# Patient Record
Sex: Female | Born: 1985 | Race: White | Hispanic: No | Marital: Single | Smoking: Former smoker
Health system: Southern US, Community
[De-identification: ages and names within clinical notes are randomized; demographics above are authoritative.]

## PROBLEM LIST (undated history)

## (undated) DIAGNOSIS — T7840XA Allergy, unspecified, initial encounter: Secondary | ICD-10-CM

## (undated) DIAGNOSIS — D72829 Elevated white blood cell count, unspecified: Secondary | ICD-10-CM

## (undated) HISTORY — DX: Elevated white blood cell count, unspecified: D72.829

## (undated) HISTORY — DX: Allergy, unspecified, initial encounter: T78.40XA

---

## 2010-01-16 ENCOUNTER — Emergency Department (HOSPITAL_COMMUNITY): Admission: EM | Admit: 2010-01-16 | Discharge: 2010-01-16 | Payer: Self-pay | Admitting: Emergency Medicine

## 2010-03-10 ENCOUNTER — Emergency Department (HOSPITAL_COMMUNITY): Admission: EM | Admit: 2010-03-10 | Discharge: 2010-03-10 | Payer: Self-pay | Admitting: Emergency Medicine

## 2010-04-17 ENCOUNTER — Emergency Department (HOSPITAL_COMMUNITY): Admission: EM | Admit: 2010-04-17 | Discharge: 2010-04-17 | Payer: Self-pay | Admitting: Emergency Medicine

## 2010-04-28 ENCOUNTER — Ambulatory Visit (HOSPITAL_COMMUNITY): Admission: RE | Admit: 2010-04-28 | Discharge: 2010-04-28 | Payer: Self-pay | Admitting: Neurology

## 2010-11-28 ENCOUNTER — Encounter: Payer: Self-pay | Admitting: Neurology

## 2011-01-23 LAB — HERPES SIMPLEX VIRUS(HSV) DNA BY PCR: HSV 1 DNA: NOT DETECTED

## 2011-01-23 LAB — CSF CELL COUNT WITH DIFFERENTIAL

## 2011-01-23 LAB — PROTEIN AND GLUCOSE, CSF: Total  Protein, CSF: 34 mg/dL (ref 15–45)

## 2011-01-24 LAB — CBC
HCT: 38.4 % (ref 36.0–46.0)
Hemoglobin: 13.1 g/dL (ref 12.0–15.0)
MCHC: 34.2 g/dL (ref 30.0–36.0)
MCV: 87.9 fL (ref 78.0–100.0)
Platelets: 373 10*3/uL (ref 150–400)
RBC: 4.37 MIL/uL (ref 3.87–5.11)

## 2011-01-24 LAB — BASIC METABOLIC PANEL
BUN: 3 mg/dL — ABNORMAL LOW (ref 6–23)
Calcium: 9.1 mg/dL (ref 8.4–10.5)
Chloride: 111 mEq/L (ref 96–112)
Creatinine, Ser: 0.56 mg/dL (ref 0.4–1.2)
GFR calc non Af Amer: >60 mL/min (ref >60)
Glucose, Bld: 96 mg/dL (ref 70–99)
Potassium: 2.9 mEq/L — ABNORMAL LOW (ref 3.5–5.1)
Sodium: 139 mEq/L (ref 135–145)

## 2011-01-24 LAB — DIFFERENTIAL
Basophils Absolute: 0.1 10*3/uL (ref 0.0–0.1)
Eosinophils Absolute: 0.2 10*3/uL (ref 0.0–0.7)
Eosinophils Relative: 1 % (ref 0–5)
Monocytes Absolute: 0.9 10*3/uL (ref 0.1–1.0)

## 2011-01-24 LAB — VITAMIN B12: Vitamin B-12: 259 pg/mL (ref 211–911)

## 2011-01-24 LAB — MISCELLANEOUS TEST

## 2015-11-28 ENCOUNTER — Encounter (HOSPITAL_COMMUNITY): Payer: Self-pay | Admitting: Emergency Medicine

## 2015-11-28 ENCOUNTER — Emergency Department (HOSPITAL_COMMUNITY)
Admission: EM | Admit: 2015-11-28 | Discharge: 2015-11-28 | Disposition: A | Discharge status: Home / Self Care | Payer: PRIVATE HEALTH INSURANCE | Attending: Emergency Medicine | Admitting: Emergency Medicine

## 2015-11-28 ENCOUNTER — Emergency Department (HOSPITAL_COMMUNITY): Payer: PRIVATE HEALTH INSURANCE

## 2015-11-28 DIAGNOSIS — J219 Acute bronchiolitis, unspecified: Secondary | ICD-10-CM | POA: Diagnosis not present

## 2015-11-28 DIAGNOSIS — R509 Fever, unspecified: Secondary | ICD-10-CM | POA: Diagnosis present

## 2015-11-28 DIAGNOSIS — Z79899 Other long term (current) drug therapy: Secondary | ICD-10-CM | POA: Diagnosis not present

## 2015-11-28 DIAGNOSIS — J40 Bronchitis, not specified as acute or chronic: Secondary | ICD-10-CM

## 2015-11-28 MED ORDER — ALBUTEROL SULFATE HFA 108 (90 BASE) MCG/ACT IN AERS
INHALATION_SPRAY | RESPIRATORY_TRACT | Status: AC
  Filled 2015-11-28: qty 6.7

## 2015-11-28 MED ORDER — BENZONATATE 100 MG PO CAPS: 100.0000 mg | ORAL_CAPSULE | Freq: Three times a day (TID) | ORAL | Status: DC

## 2015-11-28 MED ORDER — METHYLPREDNISOLONE 4 MG PO TBPK: ORAL_TABLET | ORAL | Status: DC

## 2015-11-28 MED ORDER — ALBUTEROL SULFATE HFA 108 (90 BASE) MCG/ACT IN AERS: 2.0000 | INHALATION_SPRAY | RESPIRATORY_TRACT | Status: DC | PRN

## 2015-11-28 MED ORDER — ACETAMINOPHEN 325 MG PO TABS
650.0000 mg | ORAL_TABLET | Freq: Once | ORAL | Status: AC
  Administered 2015-11-28: 650 mg via ORAL
  Filled 2015-11-28: qty 2

## 2015-11-28 MED ORDER — ALBUTEROL SULFATE HFA 108 (90 BASE) MCG/ACT IN AERS
2.0000 | INHALATION_SPRAY | RESPIRATORY_TRACT | Status: DC
  Administered 2015-11-28 (×2): 2 via RESPIRATORY_TRACT

## 2015-11-28 NOTE — Discharge Instructions (Signed)

## 2015-11-28 NOTE — ED Provider Notes (Signed)
CSN: [id number]     Arrival date & time 11/28/15  1741 History   First MD Initiated Contact with Patient 11/28/15 1813     Chief Complaint  Patient presents with  . 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  No n/v/d.  Sx are persistent.  She had tried Motrin and Mucinex with some relief.  Patient is a 30 y.o. female presenting with fever. The history is provided by the patient.  Fever   History reviewed. No pertinent past medical history. History reviewed. No pertinent past surgical history. History reviewed. No pertinent family history. Social History  Substance Use Topics  . Smoking status: Never Smoker   . Smokeless tobacco: None  . Alcohol Use: No   OB History    No data available     Review of Systems  Constitutional: Positive for fever.  All other systems reviewed and are negative.     Allergies  Review of patient's allergies indicates no known allergies.  Home Medications   Prior to Admission medications   Medication Sig Start Date End Date Taking? Authorizing Provider  albuterol (PROVENTIL HFA;VENTOLIN HFA) 108 (90 Base) MCG/ACT inhaler Inhale 2 puffs into the lungs every 4 (four) hours as needed for wheezing or shortness of breath. 11/28/15   Eber Hong, MD  benzonatate (TESSALON) 100 MG capsule Take 1 capsule (100 mg total) by mouth every 8 (eight) hours. 11/28/15   Eber Hong, MD  methylPREDNISolone (MEDROL DOSEPAK) 4 MG TBPK tablet Taper over 6 days 11/28/15   Eber Hong, MD   BP 136/79 mmHg  Pulse 108  Temp(Src) 101.4 F (38.6 C) (Oral)  Resp 16  Ht  (1.499 m)  Wt 218 lb (98.884 kg)  BMI 44.01 kg/m2   SpO2 97%  LMP 11/27/2015 Physical Exam  Constitutional: She appears well-developed and well-nourished. No distress.  HENT:  Head: Normocephalic and atraumatic.  Mouth/Throat: Oropharynx is clear and moist. No oropharyngeal exudate.  Eyes: Conjunctivae and EOM are normal. Pupils are equal, round, and reactive to light. Right eye exhibits no discharge. Left eye exhibits no discharge. No scleral icterus.  Neck: Normal range of motion. Neck supple. No JVD present. No thyromegaly present.  Cardiovascular: Normal rate, regular rhythm, normal heart sounds and intact distal pulses.  Exam reveals no gallop and no friction rub.   No murmur heard. Pulmonary/Chest: Effort normal and breath sounds normal. No respiratory distress. She has no wheezes. She has no rales.  Lungs - Normal respiratory effort, chest expands symmetrically. Lungs are clear to auscultation, no crackles or wheezes.    Abdominal: Soft. Bowel sounds are normal. She exhibits no distension and no mass. There is no tenderness.  Musculoskeletal: Normal range of motion. She exhibits no edema or tenderness.  Lymphadenopathy:    She has no cervical adenopathy.  Neurological: She is alert. Coordination normal.  Skin: Skin is warm and dry. No rash noted. No erythema.  Psychiatric: She has a normal mood and affect. Her behavior is normal.  Nursing note and vitals reviewed.   ED Course  Procedures (including critical care time) Labs Review Labs Reviewed - No data to display  Imaging  Review Dg Chest 2 View  11/28/2015  CLINICAL DATA:  Fever and cough since yesterday. EXAM: CHEST  2 VIEW COMPARISON:  None. FINDINGS: Cardiomediastinal silhouette is normal. Mediastinal contours appear intact. There is no evidence of focal airspace consolidation, pleural effusion or pneumothorax. Osseous structures are without acute abnormality. Soft tissues are grossly normal. IMPRESSION: No active cardiopulmonary disease. Electronically Signed   By: Ted Mcalpine M.D.   On: 11/28/2015 18:34   I have personally reviewed and evaluated these images and lab results as part of my medical decision-making.    MDM   Final diagnoses:  Bronchitis    URI  Xray neg meds given as below for home Very well appearing, normal VS other than fever, no signs of sepsis.  Meds given in ED:  Medications  acetaminophen (TYLENOL) tablet 650 mg (650 mg Oral Given 11/28/15 1820)    New Prescriptions   ALBUTEROL (PROVENTIL HFA;VENTOLIN HFA) 108 (90 BASE) MCG/ACT INHALER    Inhale 2 puffs into the lungs every 4 (four) hours as needed for wheezing or shortness of breath.   BENZONATATE (TESSALON) 100 MG CAPSULE    Take 1 capsule (100 mg total) by mouth every 8 (eight) hours.   METHYLPREDNISOLONE (MEDROL DOSEPAK) 4 MG TBPK TABLET    Taper over 6 days        Eber Hong, MD 11/28/15 [phone]

## 2015-11-28 NOTE — ED Notes (Addendum)
PT c/o nasal drainage and congested productive yellow sputum cough with body aches and chills x1 day. PT stated started taking mucinex yesterday. PT states she had ibuprofen  x3 hours ago.

## 2016-09-17 IMAGING — DX DG CHEST 2V
2 series · 2 of 2 positions shown · non-contrast
Comparison: None.

CLINICAL DATA: Fever and cough since yesterday.

EXAM:
CHEST  2 VIEW

[chest pa]
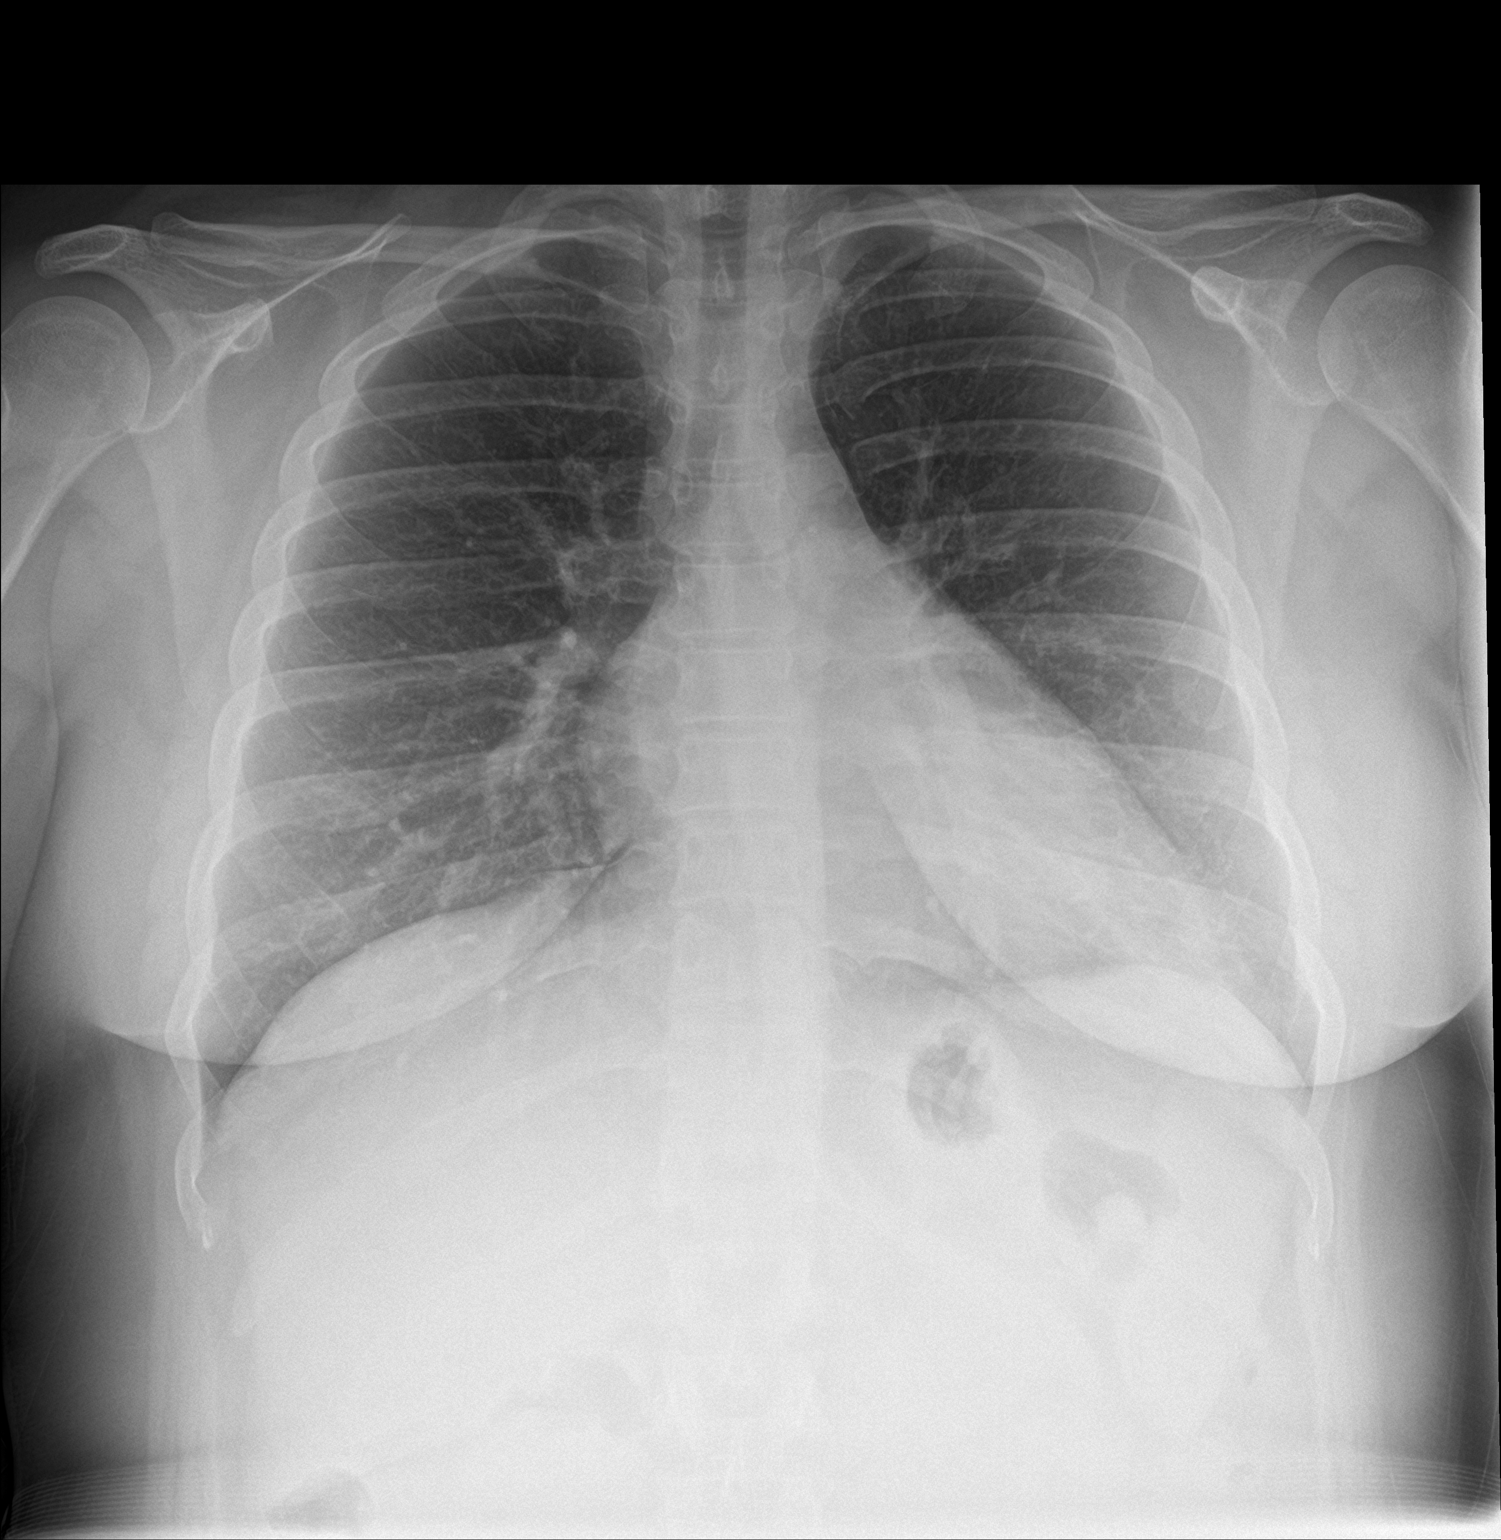

[chest lat]
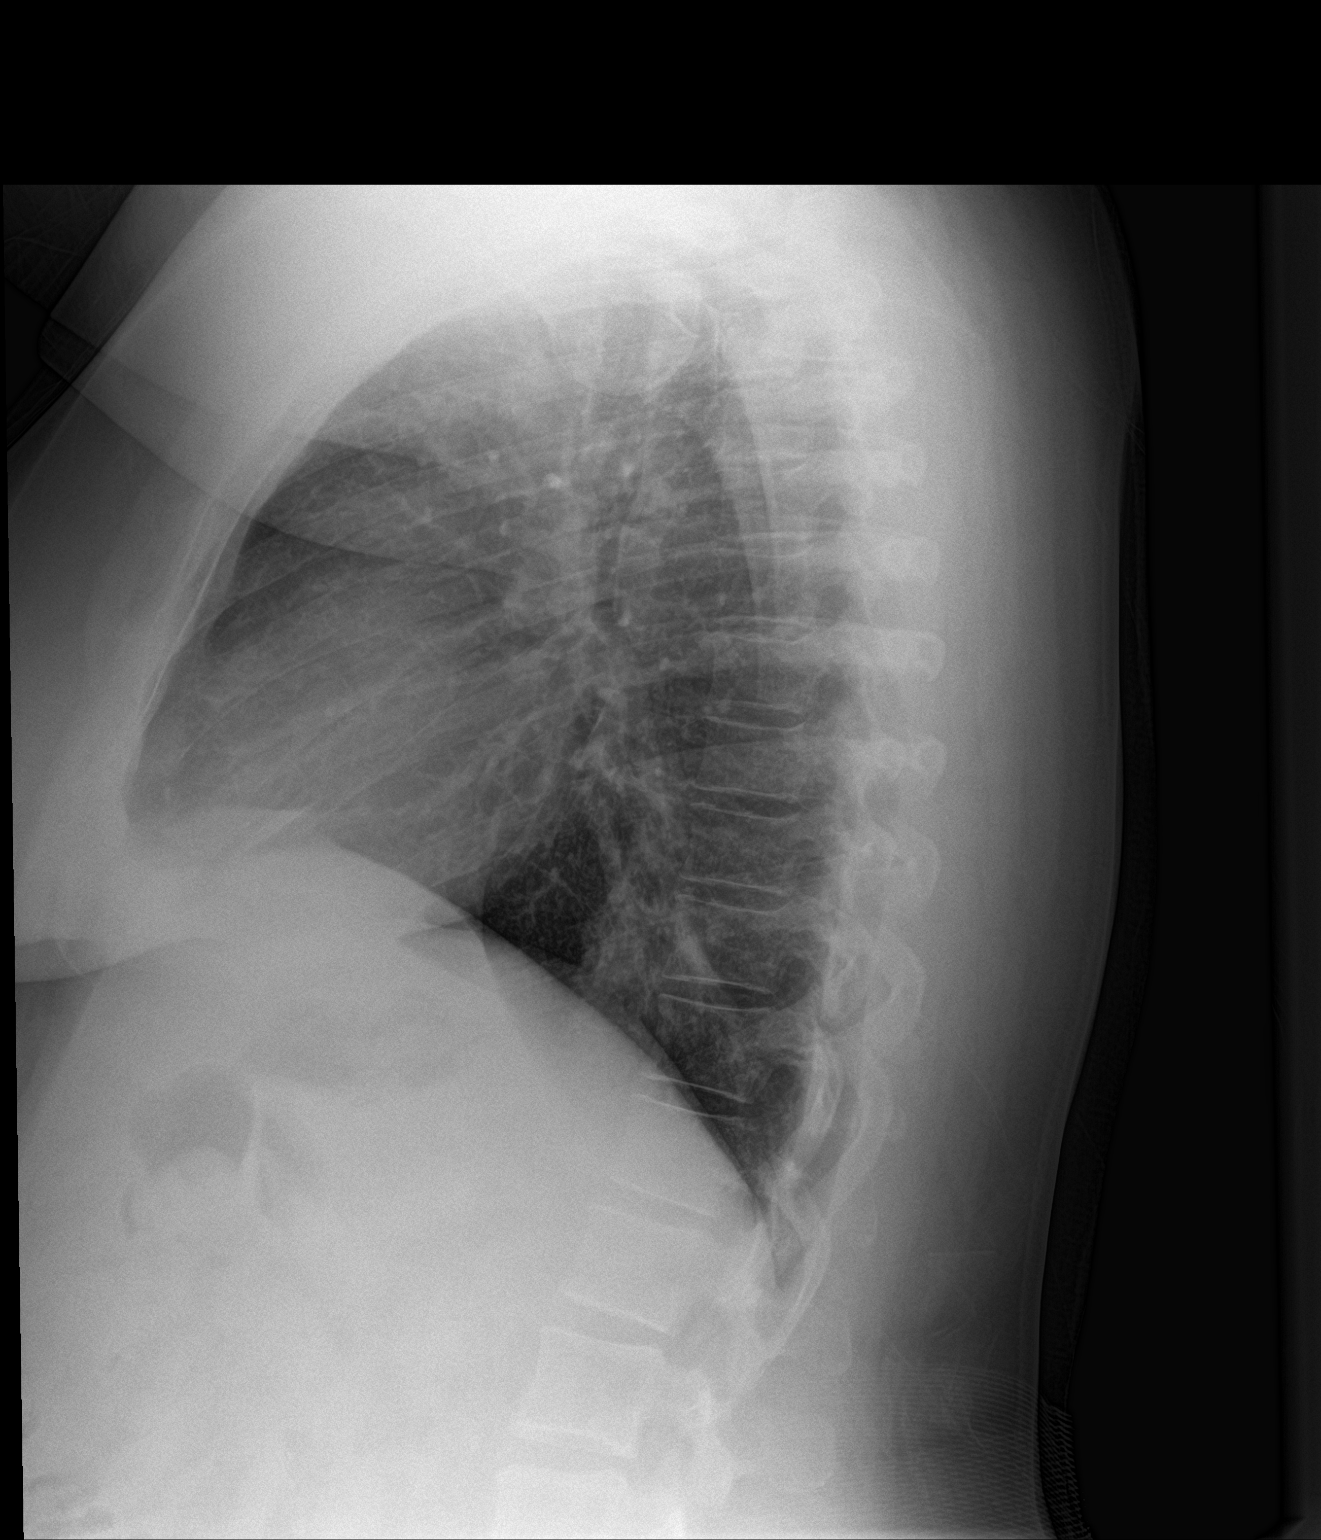

[2 of 2 positions shown; findings below may reference images not displayed]

FINDINGS: Cardiomediastinal silhouette is normal. Mediastinal contours appear
intact.

There is no evidence of focal airspace consolidation, pleural
effusion or pneumothorax.

Osseous structures are without acute abnormality. Soft tissues are
grossly normal.
IMPRESSION: No active cardiopulmonary disease.

## 2016-10-11 ENCOUNTER — Ambulatory Visit (HOSPITAL_COMMUNITY): Payer: No Typology Code available for payment source | Admitting: Oncology

## 2016-11-16 ENCOUNTER — Ambulatory Visit (HOSPITAL_COMMUNITY): Payer: No Typology Code available for payment source | Admitting: Oncology

## 2016-12-28 ENCOUNTER — Ambulatory Visit (HOSPITAL_COMMUNITY): Payer: No Typology Code available for payment source | Admitting: Oncology

## 2016-12-29 ENCOUNTER — Encounter (HOSPITAL_COMMUNITY): Payer: Self-pay | Admitting: Oncology

## 2016-12-29 ENCOUNTER — Encounter (HOSPITAL_COMMUNITY): Payer: PRIVATE HEALTH INSURANCE | Attending: Oncology | Admitting: Oncology

## 2016-12-29 ENCOUNTER — Encounter (HOSPITAL_COMMUNITY): Payer: PRIVATE HEALTH INSURANCE

## 2016-12-29 DIAGNOSIS — Z808 Family history of malignant neoplasm of other organs or systems: Secondary | ICD-10-CM | POA: Diagnosis not present

## 2016-12-29 DIAGNOSIS — Z8041 Family history of malignant neoplasm of ovary: Secondary | ICD-10-CM | POA: Diagnosis not present

## 2016-12-29 DIAGNOSIS — D720 Genetic anomalies of leukocytes: Secondary | ICD-10-CM

## 2016-12-29 DIAGNOSIS — D72829 Elevated white blood cell count, unspecified: Secondary | ICD-10-CM | POA: Insufficient documentation

## 2016-12-29 DIAGNOSIS — Z87891 Personal history of nicotine dependence: Secondary | ICD-10-CM

## 2016-12-29 DIAGNOSIS — D72820 Lymphocytosis (symptomatic): Secondary | ICD-10-CM | POA: Diagnosis not present

## 2016-12-29 DIAGNOSIS — Z800 Family history of malignant neoplasm of digestive organs: Secondary | ICD-10-CM | POA: Diagnosis not present

## 2016-12-29 HISTORY — DX: Elevated white blood cell count, unspecified: D72.829

## 2016-12-29 LAB — BASIC METABOLIC PANEL
Anion gap: 8 (ref 5–15)
BUN: 8 mg/dL (ref 6–20)
CALCIUM: 9.2 mg/dL (ref 8.9–10.3)
CHLORIDE: 105 mmol/L (ref 101–111)
CO2: 23 mmol/L (ref 22–32)
Creatinine, Ser: 0.5 mg/dL (ref 0.44–1.00)
GFR calc non Af Amer: >60 mL/min (ref >60)
GLUCOSE: 86 mg/dL (ref 65–99)
POTASSIUM: 4.3 mmol/L (ref 3.5–5.1)
SODIUM: 136 mmol/L (ref 135–145)

## 2016-12-29 LAB — CBC WITH DIFFERENTIAL/PLATELET
BASOS PCT: 0 %
Basophils Absolute: 0 10*3/uL (ref 0.0–0.1)
EOS ABS: 0.3 10*3/uL (ref 0.0–0.7)
EOS PCT: 2 %
HCT: 41.7 % (ref 36.0–46.0)
HEMOGLOBIN: 14.3 g/dL (ref 12.0–15.0)
Lymphocytes Relative: 29 %
Lymphs Abs: 3.8 10*3/uL (ref 0.7–4.0)
MCH: 29.4 pg (ref 26.0–34.0)
MCHC: 34.3 g/dL (ref 30.0–36.0)
MCV: 85.6 fL (ref 78.0–100.0)
Monocytes Absolute: 0.7 10*3/uL (ref 0.1–1.0)
Monocytes Relative: 6 %
NEUTROS PCT: 63 %
Neutro Abs: 8.2 10*3/uL — ABNORMAL HIGH (ref 1.7–7.7)
PLATELETS: 407 10*3/uL — AB (ref 150–400)
RBC: 4.87 MIL/uL (ref 3.87–5.11)
RDW: 13.1 % (ref 11.5–15.5)
WBC: 13 10*3/uL — AB (ref 4.0–10.5)

## 2016-12-29 LAB — SEDIMENTATION RATE: Sed Rate: 18 mm/hr (ref 0–22)

## 2016-12-29 LAB — C-REACTIVE PROTEIN: CRP: <0.8 mg/dL (ref <1.0)

## 2016-12-29 NOTE — Patient Instructions (Signed)
[facility] at [facility] Discharge Instructions  RECOMMENDATIONS MADE BY THE CONSULTANT AND ANY TEST RESULTS WILL BE SENT TO YOUR REFERRING PHYSICIAN.  You were seen today by Jenita Seashoreom Kefalas PA-C. Labs today, we will call you with results. Return 3-4 wks for follow up.   Thank you for choosing [facility] at [facility] to provide your oncology and hematology care.  To afford each patient quality time with our provider, please arrive at least 15 minutes before your scheduled appointment time.    If you have a lab appointment with the Cancer Center please come in thru the  Main Entrance and check in at the main information desk  You need to re-schedule your appointment should you arrive 10 or more minutes late.  We strive to give you quality time with our providers, and arriving late affects you and other patients whose appointments are after yours.  Also, if you no show three or more times for appointments you may be dismissed from the clinic at the providers discretion.     Again, thank you for choosing [facility].  Our hope is that these requests will decrease the amount of time that you wait before being seen by our physicians.       _____________________________________________________________  Should you have questions after your visit to [facility], please contact our office at [phone] between the hours of 8:30 a.m. and 4:30 p.m.  Voicemails left after 4:30 p.m. will not be returned until the following business day.  For prescription refill requests, have your pharmacy contact our office.       Resources For Cancer Patients and their Caregivers ? American Cancer Society: Can assist with transportation, wigs, general needs, runs Look Good Feel Better.        ([phone]-(403)[phone] ? Cancer Care: Provides financial assistance, online support groups, medication/co-pay assistance.  1-800-813-HOPE 986-192-1868(4673) ? [facility] Assists [place] cancer patients and their families through emotional , educational and financial support.  [phone] ? [place] DSS Where to apply for food stamps, Medicaid and utility assistance. 630-153-3837609 799 8697 ? [facility]: Transportation to medical appointments. 315-794-7721463 162 9046 ? Social Security Administration: May apply for disability if have a Stage IV cancer. ([phone] [phone])[phone] ? [place] [employer], Disability and Transit Services: Assists with nutrition, care and transit needs. 938-790-0719(407)515-0551  Cancer Center Support Programs: @10RELATIVEDAYS @ > Cancer Support Group  2nd Tuesday of the month 1pm-2pm, Journey Room  > Creative Journey  3rd Tuesday of the month 1130am-1pm, Journey Room  > Look Good Feel Better  1st Wednesday of the month 10am-12 noon, Journey Room (Call American Cancer Society to register [phone])

## 2016-12-29 NOTE — Assessment & Plan Note (Addendum)
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  I reviewed the potential causes of leukocytosis (specifically mild neutrophilia) including but not limited to: ?Any active inflammatory condition or infection ?Cigarette smoking, which may be the most common cause of mild neutrophilia ?Previously diagnosed hematologic disease (such as acute and chronic leukemias, chronic myeloproliferative or myelodysplastic disease) ?The presence of, and treatment for, a chronic anxiety state, panic disorder, rage, or emotional stress (eg, posttraumatic stress disorder, depression) ?Presence of non-hematologic diseases known to increase neutrophil counts (eg, eclampsia, thyroid storm, hypercortisolism). ?Prior splenectomy or known asplenia ?Positive family history of neutrophilia ?Recent vaccination  ?Medications - Various medications may cause neutrophilia. However,  such cases are rare and appear in the literature as isolated case reports.  Plan today is to proceed with a CBC with peripheral smear review, evaluation for MPD, BCR-ABL to r/o CML.  CRP and ESR to look for occult inflammatory disease  We will see the patient back once lab results are reported in approximately 3-4 weeks. We will go over everything at that time.  If workup is negative, the frequency of follow-up appointment with labs will be significantly reduced.

## 2016-12-29 NOTE — Progress Notes (Signed)
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  HISTORY OF PRESENT ILLNESS:   Shirley Jacobs is a 31 y.o. 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  She was referred to hematology in the past and scheduled for an appointment but she was a no-show.  She reports that she was "scared."  She reports being told for years that she has had a high white blood cell count.  She is asymptomatic from this standpoint.  She notes one, maybe 2, antibiotics in the past 12 months.  She reports an episode in September in which she was congested and needed an antibiotic.  She denies any hospitalizations.  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  She denies any B symptoms.  Her appetite is good.  She denies any unintentional weight loss.  Denies any chest pain or abdominal pain.  Denies any lumps or bumps.  She is hesitant about future follow-up and really does not want to come back if not needed.  She is advised that we will start her workup for leukocytosis and given the patient reported stability of this issue we suspect this will be a benign problem.  She does admit to a 5-pack-year smoking history having smoked a half a pack per day 10 years.  She quit smoking 3 years ago.  Review of Systems   Constitutional: Negative.  Negative for chills, fever and weight loss.  HENT: Negative.   Eyes: Negative.   Respiratory: Negative.  Negative for cough.   Cardiovascular: Negative.  Negative for chest pain.  Gastrointestinal: Negative.  Negative for blood in stool, constipation, diarrhea, melena, nausea and vomiting.  Genitourinary: Negative.   Musculoskeletal: Negative.   Skin: Negative.   Neurological: Negative.  Negative for weakness.  Endo/Heme/Allergies: Negative.   Psychiatric/Behavioral: Negative.      PAST MEDICAL HISTORY:   Past Medical History:  Diagnosis Date  . Allergy   . Leukocytosis 12/29/2016    ALLERGIES: Allergies  Allergen Reactions  . Reglan [Metoclopramide]     Felt anxious      MEDICATIONS: I have reviewed the patient's current medications.    Current Outpatient Prescriptions on File Prior to Visit  Medication Sig Dispense Refill  . ibuprofen (ADVIL,MOTRIN) 200 MG tablet Take 200 mg by mouth every 6 (six) hours as needed for mild pain or moderate pain.     No current facility-administered medications on file prior to visit.      PAST SURGICAL HISTORY History reviewed. No pertinent surgical history.  FAMILY HISTORY: Family History  Problem Relation Age of Onset  . Obesity Mother   . Hypertension Mother   . Cancer Maternal Grandmother     uterine  . Cancer Maternal Grandfather     stomach, liver  . Emphysema Paternal Grandmother   . Cancer Cousin 25    ovarian, had hysterectomy  Mother is alive at the age of 43 in good health with hypertension and obesity. Father is alive and in his 35s.  He is estranged from the family. Patient has no siblings. Patient has no children. Patient does not have a family history of an elevated white blood cell count.  SOCIAL HISTORY:  reports that she quit smoking about 3 years ago. Her smoking use included Cigarettes. She has a 5.00 pack-year smoking history. She has never used smokeless tobacco. She  reports that she does not drink alcohol or use drugs.  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  She is [place] and religion.  She is married 2 years.  She has no children.  Social History   Social History  . Marital status: Single    Spouse name: N/A  . Number of children: N/A  . Years of education: N/A   Social History Main Topics  . Smoking status: Former Smoker    Packs/day: 0.50    Years: 10.00    Types: Cigarettes    Quit date: 12/29/2013  . Smokeless tobacco: Never Used  . Alcohol use No  . Drug use: No  . Sexual activity: Yes   Other Topics Concern  . None   Social History Narrative  . None    PERFORMANCE STATUS: The patient's performance status is 0 - Asymptomatic  PHYSICAL EXAM: Most Recent Vital Signs: Blood pressure (!) 91/43, pulse 81, resp. rate 16, height 4' 11"  (1.499 m), weight 213 lb (96.6 kg), SpO2 99 %. BP (!) 91/43 (BP Location: Left Arm, Patient Position: Sitting)   Pulse 81   Resp 16   Ht 4' 11"  (1.499 m)   Wt 213 lb (96.6 kg)   SpO2 99%   BMI 43.02 kg/m   General Appearance:    Alert, cooperative, no distress, appears stated age, obese  Head:    Normocephalic, without obvious abnormality, atraumatic  Eyes:    PERRL, conjunctiva/corneas clear, EOM's intact, fundi    benign, both eyes  Ears:    Not examined  Nose:   Nares normal, septum midline, mucosa normal, no drainage    or sinus tenderness  Throat:   Lips, mucosa, and tongue normal; teeth and gums normal  Neck:   Supple, symmetrical, trachea midline, no adenopathy;    thyroid:  no enlargement/tenderness/nodules; no carotid   bruit or JVD  Back:     Symmetric, no curvature, ROM normal, no CVA tenderness  Lungs:     Clear to auscultation bilaterally, respirations unlabored  Chest Wall:    No tenderness or deformity   Heart:    Regular rate and rhythm, S1 and S2 normal, no murmur, rub   or gallop  Breast Exam:    No tenderness, masses, or nipple  abnormality  Abdomen:     Soft, non-tender, bowel sounds active all four quadrants,    no masses, no organomegaly, obese  Genitalia:    Not examined  Rectal:    Not examined  Extremities:   Extremities normal, atraumatic, no cyanosis or edema  Pulses:   2+ and symmetric all extremities  Skin:   Skin color, texture, turgor normal, no rashes or lesions  Lymph nodes:   Cervical, supraclavicular, and axillary nodes normal  Neurologic:   CNII-XII intact, normal strength, sensation and reflexes    throughout    LABORATORY DATA:  No results found for this or any previous visit (from the past 48 hour(s)).  RADIOGRAPHY: No results found.  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  I reviewed the potential causes of leukocytosis (specifically mild neutrophilia) including but not limited to: ?Any active inflammatory condition or infection ?Cigarette smoking, which may be the most common cause of mild neutrophilia ?Previously diagnosed hematologic disease (such as acute and chronic leukemias, chronic myeloproliferative or myelodysplastic disease) ?The presence of, and treatment for, a chronic anxiety state, panic disorder, rage, or emotional stress (eg, posttraumatic stress disorder, depression) ?Presence of non-hematologic diseases known to increase neutrophil counts (eg, eclampsia, thyroid storm, hypercortisolism). ?Prior splenectomy or known asplenia ?Positive family history of neutrophilia ?Recent vaccination  ?Medications - Various medications may cause neutrophilia. However,  such cases are rare and appear in the literature as isolated case reports.  Plan today is to proceed with a CBC with peripheral smear  review, evaluation for MPD, BCR-ABL to r/o CML.  CRP and ESR to look for occult inflammatory disease  We will see the patient back once lab results are reported in approximately 3-4 weeks. We will go over everything at that time.  If workup is negative, the frequency of follow-up appointment with labs will be significantly reduced.   ORDERS PLACED FOR THIS ENCOUNTER: Orders Placed This Encounter  Procedures  . CBC with Differential  . Basic metabolic panel  . Pathologist smear review  . JAK2 V617F, w Reflex to CALR/E12/MPL  . BCR-ABL1, CML/ALL, PCR, QUANT  . Sedimentation rate  . C-reactive protein  . Miscellaneous [facility] test (send-out)    MEDICATIONS PRESCRIBED THIS ENCOUNTER: Meds ordered this encounter  Medications  . metroNIDAZOLE (METROGEL) 0.75 % vaginal gel    Sig: USE A PEA-SIZED DROP INTRAVAGINALLY ONCE DAILY    Refill:  2    All questions were answered. The patient knows to call the clinic with any problems, questions or concerns. We can certainly see the patient much sooner if necessary.  Patient discussed with Dr. Talbert Cage and together we ascertained an up-to-date interval history, and examined the patient.  Dr. Talbert Cage developed the patient's assessment and plan.  This was a shared visit-consultation.  Her attestation will follow below.  This note is electronically signed by: Doy Mince 12/29/2016 6:13 PM

## 2017-01-03 LAB — BCR-ABL1, CML/ALL, PCR, QUANT

## 2017-01-13 LAB — CALR + JAK2 E12-15 + MPL (REFLEXED)

## 2017-01-13 LAB — JAK2 V617F, W REFLEX TO CALR/E12/MPL

## 2017-01-25 ENCOUNTER — Ambulatory Visit (HOSPITAL_COMMUNITY): Payer: No Typology Code available for payment source

## 2023-02-21 ENCOUNTER — Ambulatory Visit
Admission: RE | Admit: 2023-02-21 | Discharge: 2023-02-21 | Disposition: A | Discharge status: Home / Self Care | Payer: BLUE CROSS/BLUE SHIELD | Source: Ambulatory Visit | Attending: Nurse Practitioner | Admitting: Nurse Practitioner

## 2023-02-21 VITALS — BP 140/79 | HR 93 | Temp 98.9°F | Resp 16

## 2023-02-21 DIAGNOSIS — J209 Acute bronchitis, unspecified: Secondary | ICD-10-CM

## 2023-02-21 DIAGNOSIS — H1033 Unspecified acute conjunctivitis, bilateral: Secondary | ICD-10-CM

## 2023-02-21 MED ORDER — POLYMYXIN B-TRIMETHOPRIM 10000-0.1 UNIT/ML-% OP SOLN
1.0000 [drp] | OPHTHALMIC | 0 refills | Status: AC
Start: 2023-02-21 — End: 2023-02-28

## 2023-02-21 MED ORDER — BENZONATATE 200 MG PO CAPS
200.0000 mg | ORAL_CAPSULE | Freq: Three times a day (TID) | ORAL | 0 refills | Status: AC | PRN
Start: 2023-02-21 — End: ?

## 2023-02-21 MED ORDER — FLUCONAZOLE 150 MG PO TABS
150.0000 mg | ORAL_TABLET | Freq: Every day | ORAL | 0 refills | Status: AC
Start: 2023-02-21 — End: ?

## 2023-02-21 MED ORDER — AMOXICILLIN-POT CLAVULANATE 875-125 MG PO TABS
1.0000 | ORAL_TABLET | Freq: Two times a day (BID) | ORAL | 0 refills | Status: AC
Start: 2023-02-21 — End: ?

## 2023-02-21 NOTE — ED Triage Notes (Signed)
Pt reports cough, ear pressure, itchy/scratchy throat and congestion for over a week. Took ibuprofen. Yesterday started having eye irritation red and drainage.

## 2023-02-21 NOTE — ED Provider Notes (Signed)
[facility]    CSN: [id number] Arrival date & time: 02/21/23  1154      History   Chief Complaint Chief Complaint  Patient presents with   appt 12    HPI ELA MOFFAT is a 37 y.o. female  presents for evaluation of URI symptoms for 1.5 weeks. Patient reports associated symptoms of active cough, ear pain/pressure, scratchy throat, congestion.  Patient developed bilateral eye redness with goopy drainage yesterday.  Denies N/V/D, fevers, body aches, shortness of breath. Patient does not have a hx of asthma.  No known sick contacts.  Pt has taken ibuprofen OTC for symptoms. Pt has no other concerns at this time.   HPI  Past Medical History:  Diagnosis Date   Allergy    Leukocytosis 12/29/2016    Patient Active Problem List   Diagnosis Date Noted   Leukocytosis 12/29/2016    History reviewed. No pertinent surgical history.  OB History   No obstetric history on file.      Home Medications    Prior to Admission medications   Medication Sig Start Date End Date Taking? Authorizing Provider  amoxicillin-clavulanate (AUGMENTIN) 875-125 MG tablet Take 1 tablet by mouth every 12 (twelve) hours. 02/21/23  Yes Radford Pax, NP  benzonatate (TESSALON) 200 MG capsule Take 1 capsule (200 mg total) by mouth 3 (three) times daily as needed for cough. 02/21/23  Yes Radford Pax, NP  fluconazole (DIFLUCAN) 150 MG tablet Take 1 tablet (150 mg total) by mouth daily. Take one on ay three of antibiotics and then one on day 6 of antibiotics 02/21/23  Yes Radford Pax, NP  trimethoprim-polymyxin b (POLYTRIM) ophthalmic solution Place 1 drop into both eyes every 4 (four) hours for 7 days. 02/21/23 02/28/23 Yes Radford Pax, NP  ibuprofen (ADVIL,MOTRIN) 200 MG tablet Take 200 mg by mouth every 6 (six) hours as needed for mild pain or moderate pain.    [provider]  metroNIDAZOLE (METROGEL) 0.75 % vaginal gel USE A PEA-SIZED DROP INTRAVAGINALLY ONCE DAILY 12/12/16    [provider]    Family History Family History  Problem Relation Age of Onset   Obesity Mother    Hypertension Mother    Cancer Maternal Grandmother        uterine   Cancer Maternal Grandfather        stomach, liver   Emphysema Paternal Grandmother    Cancer Cousin 25       ovarian, had hysterectomy    Social History Social History   Tobacco Use   Smoking status: Former    Packs/day: 0.50    Years: 10.00    Additional pack years: 0.00    Total pack years: 5.00    Types: Cigarettes    Quit date: 12/29/2013    Years since quitting: 9.1   Smokeless tobacco: Never  Substance Use Topics   Alcohol use: No   Drug use: No     Allergies   Reglan [metoclopramide]   Review of Systems Review of Systems  HENT:  Positive for congestion, ear pain and sore throat.   Eyes:  Positive for discharge and redness.  Respiratory:  Positive for cough.      Physical Exam Triage Vital Signs ED Triage Vitals  Enc Vitals Group     BP 02/21/23 1210 (!) 140/79     Pulse Rate 02/21/23 1210 93     Resp 02/21/23 1210 16     Temp 02/21/23 1210 98.9  F (37.2 C)     Temp Source 02/21/23 1210 Oral     SpO2 02/21/23 1210 99 %     Weight --      Height --      Head Circumference --      Peak Flow --      Pain Score 02/21/23 1208 0     Pain Loc --      Pain Edu? --      Excl. in GC? --    No data found.  Updated Vital Signs BP (!) 140/79 (BP Location: Left Arm)   Pulse 93   Temp 98.9 F (37.2 C) (Oral)   Resp 16   LMP 01/25/2023   SpO2 99%   Visual Acuity Right Eye Distance:   Left Eye Distance:   Bilateral Distance:    Right Eye Near:   Left Eye Near:    Bilateral Near:     Physical Exam Vitals and nursing note reviewed.  Constitutional:      General: She is not in acute distress.    Appearance: She is well-developed. She is not ill-appearing.  HENT:     Head: Normocephalic and atraumatic.     Right Ear: Tympanic membrane and ear canal normal.      Left Ear: Tympanic membrane and ear canal normal.     Nose: Congestion present.     Mouth/Throat:     Mouth: Mucous membranes are moist.     Pharynx: Oropharynx is clear. Uvula midline. Posterior oropharyngeal erythema present.     Tonsils: No tonsillar exudate or tonsillar abscesses.  Eyes:     General: Lids are normal.     Conjunctiva/sclera:     Right eye: Right conjunctiva is injected. No chemosis, exudate or hemorrhage.    Left eye: Left conjunctiva is injected. No chemosis, exudate or hemorrhage.    Pupils: Pupils are equal, round, and reactive to light.  Cardiovascular:     Rate and Rhythm: Normal rate and regular rhythm.     Heart sounds: Normal heart sounds.  Pulmonary:     Effort: Pulmonary effort is normal.     Breath sounds: Normal breath sounds.  Musculoskeletal:     Cervical back: Normal range of motion and neck supple.  Lymphadenopathy:     Cervical: No cervical adenopathy.  Skin:    General: Skin is warm and dry.  Neurological:     General: No focal deficit present.     Mental Status: She is alert and oriented to person, place, and time.  Psychiatric:        Mood and Affect: Mood normal.        Behavior: Behavior normal.      UC Treatments / Results  Labs (all labs ordered are listed, but only abnormal results are displayed) Labs Reviewed - No data to display  EKG   Radiology No results found.  Procedures Procedures (including critical care time)  Medications Ordered in UC Medications - No data to display  Initial Impression / Assessment and Plan / UC Course  I have reviewed the triage vital signs and the nursing notes.  Pertinent labs & imaging results that were available during my care of the patient were reviewed by me and considered in my medical decision making (see chart for details).     Patient is well-appearing and in no acute distress Start Augmentin given length of symptoms Tessalon as needed for cough Patient reports antibiotic  induced yeast infection, Diflucan prescribed Discussed  different types of conjunctivitis, will start Polytrim antibacterial eyedrops.  Warm compresses to the eyes as needed PCP follow-up if symptoms do not improve ER precautions reviewed and patient verbalized understanding Final Clinical Impressions(s) / UC Diagnoses   Final diagnoses:  Acute bronchitis, unspecified organism  Acute conjunctivitis of both eyes, unspecified acute conjunctivitis type     Discharge Instructions      Augmentin twice daily for 7 days Tessalon as needed for cough Antibiotic eyedrops as prescribed.  Warm compresses to the eyes as needed Diflucan on day 3 and 6 of antibiotics Rest and fluids Follow-up with your PCP if your symptoms do not improve Please go to the emergency room if you have any worsening symptoms Hope you feel better soon!   ED Prescriptions     Medication Sig Dispense Auth. Provider   amoxicillin-clavulanate (AUGMENTIN) 875-125 MG tablet Take 1 tablet by mouth every 12 (twelve) hours. 14 tablet Radford Pax, NP   benzonatate (TESSALON) 200 MG capsule Take 1 capsule (200 mg total) by mouth 3 (three) times daily as needed for cough. 20 capsule Radford Pax, NP   trimethoprim-polymyxin b (POLYTRIM) ophthalmic solution Place 1 drop into both eyes every 4 (four) hours for 7 days. 10 mL Radford Pax, NP   fluconazole (DIFLUCAN) 150 MG tablet Take 1 tablet (150 mg total) by mouth daily. Take one on ay three of antibiotics and then one on day 6 of antibiotics 2 tablet Radford Pax, NP      PDMP not reviewed this encounter.   Radford Pax, NP 02/21/23 1230

## 2023-02-21 NOTE — Discharge Instructions (Signed)
Augmentin twice daily for 7 days Tessalon as needed for cough Antibiotic eyedrops as prescribed.  Warm compresses to the eyes as needed Diflucan on day 3 and 6 of antibiotics Rest and fluids Follow-up with your PCP if your symptoms do not improve Please go to the emergency room if you have any worsening symptoms Hope you feel better soon!
